# Patient Record
Sex: Female | Born: 1970 | Race: White | Hispanic: No | Smoking: Never smoker
Health system: Southern US, Community
[De-identification: ages and names within clinical notes are randomized; demographics above are authoritative.]

## PROBLEM LIST (undated history)

## (undated) DIAGNOSIS — R87619 Unspecified abnormal cytological findings in specimens from cervix uteri: Secondary | ICD-10-CM

## (undated) DIAGNOSIS — Z1371 Encounter for nonprocreative screening for genetic disease carrier status: Secondary | ICD-10-CM

## (undated) DIAGNOSIS — C50912 Malignant neoplasm of unspecified site of left female breast: Secondary | ICD-10-CM

## (undated) DIAGNOSIS — F329 Major depressive disorder, single episode, unspecified: Secondary | ICD-10-CM

## (undated) DIAGNOSIS — F419 Anxiety disorder, unspecified: Secondary | ICD-10-CM

## (undated) DIAGNOSIS — F32A Depression, unspecified: Secondary | ICD-10-CM

## (undated) HISTORY — DX: Anxiety disorder, unspecified: F41.9

## (undated) HISTORY — DX: Encounter for nonprocreative screening for genetic disease carrier status: Z13.71

## (undated) HISTORY — DX: Malignant neoplasm of unspecified site of left female breast: C50.912

## (undated) HISTORY — DX: Major depressive disorder, single episode, unspecified: F32.9

## (undated) HISTORY — DX: Unspecified abnormal cytological findings in specimens from cervix uteri: R87.619

## (undated) HISTORY — DX: Depression, unspecified: F32.A

---

## 1997-05-19 DIAGNOSIS — C50912 Malignant neoplasm of unspecified site of left female breast: Secondary | ICD-10-CM

## 1997-05-19 HISTORY — PX: BREAST LUMPECTOMY: SHX2

## 1997-05-19 HISTORY — DX: Malignant neoplasm of unspecified site of left female breast: C50.912

## 1998-05-19 HISTORY — PX: TUBAL LIGATION: SHX77

## 2012-03-08 DIAGNOSIS — Z1371 Encounter for nonprocreative screening for genetic disease carrier status: Secondary | ICD-10-CM

## 2012-03-08 HISTORY — DX: Encounter for nonprocreative screening for genetic disease carrier status: Z13.71

## 2012-05-19 HISTORY — PX: BREAST RECONSTRUCTION: SHX9

## 2014-11-14 ENCOUNTER — Encounter: Payer: Self-pay | Admitting: Nurse Practitioner

## 2014-11-14 ENCOUNTER — Telehealth: Payer: Self-pay | Admitting: Nurse Practitioner

## 2014-11-14 ENCOUNTER — Ambulatory Visit (INDEPENDENT_AMBULATORY_CARE_PROVIDER_SITE_OTHER): Payer: PRIVATE HEALTH INSURANCE | Admitting: Nurse Practitioner

## 2014-11-14 VITALS — BP 110/66 | HR 68 | Ht 70.5 in | Wt 180.0 lb

## 2014-11-14 DIAGNOSIS — Z113 Encounter for screening for infections with a predominantly sexual mode of transmission: Secondary | ICD-10-CM

## 2014-11-14 DIAGNOSIS — Z01419 Encounter for gynecological examination (general) (routine) without abnormal findings: Secondary | ICD-10-CM | POA: Diagnosis not present

## 2014-11-14 DIAGNOSIS — Z0000 Encounter for general adult medical examination without abnormal findings: Secondary | ICD-10-CM

## 2014-11-14 DIAGNOSIS — C50912 Malignant neoplasm of unspecified site of left female breast: Secondary | ICD-10-CM

## 2014-11-14 NOTE — Telephone Encounter (Signed)
Routing to Patricia Rolen-Grubb, FNP for review and advise. 

## 2014-11-14 NOTE — Progress Notes (Signed)
44 y.o. G69P0101 Widowed  Caucasian Fe here for NGYN annual exam.  She has been diagnosed with left breast cancer in 1999.  She was given Tamoxifen for 5 years.  She then had breast reconstruction in 2014 and because of irregular cells on the right breast - she was advised to go back on Tamoxifen for another 5 years. Since back on tamoxifen having irregular menses.  Currently no menses for 6 months until this cycle on 11/12/14.  Moved to [place] a year ago.  New relationship for a month.  Prior to that she had been with a former partner and felt really irritated and treated with OTC antifungal which seemed to help.  She would like STD testing.  She also would like to establish with Oncologist here as well. She has not had a recent Mammo.  Patient's last menstrual period was 11/12/2014 (exact date).          Sexually active: Yes.    The current method of family planning is tubal ligation.    Exercising: Yes.    eliptical, weight machines at gym 2-3 times per week Smoker:  no  Health Maintenance: Pap:  2015, normal per patient, pos HR HPV 15 years ago, normal since MMG:  2015, normal in the spring Colonoscopy:  W/in 10 years- normal due to breast cancer  BMD:   W/10 years due to chemo and breast cancer, normal TDaP:  UTD Labs: PCP   reports that she has never smoked. She has never used smokeless tobacco. She reports that she drinks alcohol. She reports that she does not use illicit drugs.  Past Medical History  Diagnosis Date  . Breast cancer, left breast 1999    estrogen fed  . Anxiety   . Depression   . Abnormal Pap smear of cervix 15 years ago    pos HR HPV    Past Surgical History  Procedure Laterality Date  . Tubal ligation Bilateral 2000  . Breast lumpectomy Left 1999    treated with chemo and radiation, then Tamoxifem  . Breast reconstruction Bilateral 2014    lift(left) and reduction (right)  back on Tamoxifem late 2013    Current Outpatient Prescriptions  Medication Sig Dispense  Refill  . ALPRAZolam (XANAX) 0.5 MG tablet Take 0.25 mg by mouth at bedtime as needed for anxiety.    Marland Kitchen loratadine (CLARITIN) 10 MG tablet Take 10 mg by mouth daily.    . montelukast (SINGULAIR) 10 MG tablet Take 10 mg by mouth at bedtime.    . sertraline (ZOLOFT) 50 MG tablet Take 150 mg by mouth daily.    . tamoxifen (NOLVADEX) 20 MG tablet Take 20 mg by mouth daily.     No current facility-administered medications for this visit.    Family History  Problem Relation Age of Onset  . Breast cancer Maternal Grandmother   . Stroke Maternal Grandfather   . Heart disease Paternal Grandfather   . Bipolar disorder Son     ROS:  Pertinent items are noted in HPI.  Otherwise, a comprehensive ROS was negative.  Exam:   BP 110/66 mmHg  Pulse 68  Ht 5' 10.5" (1.791 m)  Wt 180 lb (81.647 kg)  BMI 25.45 kg/m2  LMP 11/12/2014 (Exact Date) Height: 5' 10.5" (179.1 cm) Ht Readings from Last 3 Encounters:  11/14/14 5' 10.5" (1.791 m)    General appearance: alert, cooperative and appears stated age Head: Normocephalic, without obvious abnormality, atraumatic Neck: no adenopathy, supple, symmetrical, trachea midline and  thyroid normal to inspection and palpation and enlarged Lungs: clear to auscultation bilaterally Breasts: surgical changes bilaterally, radiation changes on the left. Heart: regular rate and rhythm Abdomen: soft, non-tender; no masses,  no organomegaly Extremities: extremities normal, atraumatic, no cyanosis or edema Skin: Skin color, texture, turgor normal. No rashes or lesions Lymph nodes: Cervical, supraclavicular, and axillary nodes normal. No abnormal inguinal nodes palpated Neurologic: Grossly normal   Pelvic: External genitalia:  no lesions              Urethra:  normal appearing urethra with no masses, tenderness or lesions              Bartholin's and Skene's: normal                 Vagina: normal appearing vagina with normal color and moderate vaginal bleeding, no  lesions              Cervix: anteverted              Pap taken: Yes.   Bimanual Exam:  Uterus:  normal size, contour, position, consistency, mobility, non-tender              Adnexa: no mass, fullness, tenderness               Rectovaginal: Confirms               Anus:  normal sphincter tone, no lesions  Chaperone present: Yes  A:  Well Woman with normal exam  S/P left Breast Cancer 1999, +ER/PR  BRCA negative  S/P bilateral reconstruction 2014 - back on Tamoxifen  Irregular menses since back on Tamoxifen  Remote history of abnormal pap late 20's with + HR HPV  R/O STD's  P:   Reviewed health and wellness pertinent to exam  Pap smear as above  Mammogram is due and she is given information for calling to schedule  Will refer her to Oncology   Follow with pap and other labs  Counseled on breast self exam, mammography screening, adequate intake of calcium and vitamin D, diet and exercise return annually or prn  An After Visit Summary was printed and given to the patient.

## 2014-11-14 NOTE — Telephone Encounter (Signed)
Patient was told to have some labs one at her PCP. Patient cannot remember which labs she needs to have done. Last seen today with Tammy Bolton.

## 2014-11-14 NOTE — Patient Instructions (Addendum)

## 2014-11-14 NOTE — Telephone Encounter (Signed)
On the bottom of her instruction sheet to take home was this information:  Have her PCP to check her thyroid because it was upper normal in size and to do a TSH.

## 2014-11-14 NOTE — Progress Notes (Signed)
Encounter reviewed by Dr. Aundria Rud. Having skipped cycles.  On Tamoxifen.

## 2014-11-15 ENCOUNTER — Other Ambulatory Visit: Payer: Self-pay | Admitting: Certified Nurse Midwife

## 2014-11-15 ENCOUNTER — Telehealth: Payer: Self-pay | Admitting: *Deleted

## 2014-11-15 DIAGNOSIS — B373 Candidiasis of vulva and vagina: Secondary | ICD-10-CM

## 2014-11-15 DIAGNOSIS — B3731 Acute candidiasis of vulva and vagina: Secondary | ICD-10-CM

## 2014-11-15 LAB — WET PREP BY MOLECULAR PROBE
CANDIDA SPECIES: POSITIVE — AB
GARDNERELLA VAGINALIS: NEGATIVE
Trichomonas vaginosis: NEGATIVE

## 2014-11-15 LAB — STD PANEL
HIV 1&2 Ab, 4th Generation: NONREACTIVE
Hepatitis B Surface Ag: NEGATIVE

## 2014-11-15 MED ORDER — TERCONAZOLE 0.4 % VA CREA: 1.0000 | TOPICAL_CREAM | Freq: Every day | VAGINAL | Status: DC

## 2014-11-15 NOTE — Telephone Encounter (Signed)
I have attempted to contact this patient by phone with the following results: left message to return call to [place] at [phone] on answering machine (mobile per [facility]). Pt name verified in voicemail. Advised message is regarding recent labs and medication has been called to pharmacy for yeast infection. Advised I will be out of the office this afternoon, but OK to return call on Thursday if she is not able to call back today. [phone] (Mobile)

## 2014-11-15 NOTE — Telephone Encounter (Signed)
-----   Message from Regina Eck, CNM sent at 11/15/2014 11:44 AM EDT ----- Notify patient that affirm is positive for yeast. Order in for Terazol 7, advise if problems. BV and trichomonas negative

## 2014-11-15 NOTE — Telephone Encounter (Signed)
Left message to call Izac Faulkenberry at [phone]. 

## 2014-11-16 LAB — IPS N GONORRHOEA AND CHLAMYDIA BY PCR

## 2014-11-17 ENCOUNTER — Telehealth: Payer: Self-pay | Admitting: *Deleted

## 2014-11-17 LAB — IPS PAP TEST WITH HPV

## 2014-11-17 NOTE — Telephone Encounter (Signed)
Received referral from OBG Mardene Celeste Grubbs's office).  Called and left a message for the pt to return my call so I can find out where she has been treated at so I can obtain records and get her scheduled w/ a Med Onc.

## 2014-11-17 NOTE — Telephone Encounter (Signed)
Pt notified in result note.  Closing encounter. 

## 2014-11-22 ENCOUNTER — Telehealth: Payer: Self-pay | Admitting: *Deleted

## 2014-11-22 NOTE — Telephone Encounter (Signed)
Left message for the pt to return my call so I can schedule her w/ a med onc.

## 2014-11-23 NOTE — Telephone Encounter (Signed)
Left message to call Kaitlyn at [phone]. 

## 2014-11-24 ENCOUNTER — Telehealth: Payer: Self-pay | Admitting: *Deleted

## 2014-11-24 NOTE — Telephone Encounter (Signed)
Pt returned my call & I confirmed 12/13/14 med onc appt w/ pt.  Informed her that I need to obtain her paperwork from the other state and she has it and will bring by today.  I placed her calendar, welcoming packet & intake form w/ a medical release to sign w/ Mrs. Darryll Capers at the front desk this morning w/ instructions for the paperwork to come back to me.  Pt has [employer] and does not require a Referral Auth.  Made Kem Boroughs at referring office aware.

## 2014-11-28 ENCOUNTER — Other Ambulatory Visit: Payer: Self-pay

## 2014-11-28 DIAGNOSIS — Z1231 Encounter for screening mammogram for malignant neoplasm of breast: Secondary | ICD-10-CM

## 2014-11-29 NOTE — Telephone Encounter (Signed)
Spoke with patient. Patient states that she had labs performed yesterday. Advised of message as seen below from Milford Cage, [place]. Patient is agreeable and states "A lot of lab work was done." Unsure if TSH was performed. Advised this is the testing that Milford Cage, FNP recommended and to check with PCP. Patient is agreeable. Will return call if she needs anything.  Routing to provider for final review. Patient agreeable to disposition. Will close encounter.   Patient aware provider will review message and nurse will return call if any additional advice or change of disposition.

## 2014-12-11 ENCOUNTER — Ambulatory Visit
Admission: RE | Admit: 2014-12-11 | Discharge: 2014-12-11 | Disposition: A | Discharge status: Home / Self Care | Payer: PRIVATE HEALTH INSURANCE | Source: Ambulatory Visit

## 2014-12-11 DIAGNOSIS — Z1231 Encounter for screening mammogram for malignant neoplasm of breast: Secondary | ICD-10-CM

## 2014-12-12 ENCOUNTER — Other Ambulatory Visit: Payer: Self-pay | Admitting: Family Medicine

## 2014-12-12 DIAGNOSIS — E049 Nontoxic goiter, unspecified: Secondary | ICD-10-CM

## 2014-12-13 ENCOUNTER — Encounter: Payer: Self-pay | Admitting: Hematology and Oncology

## 2014-12-13 ENCOUNTER — Ambulatory Visit (HOSPITAL_BASED_OUTPATIENT_CLINIC_OR_DEPARTMENT_OTHER): Payer: PRIVATE HEALTH INSURANCE | Admitting: Hematology and Oncology

## 2014-12-13 ENCOUNTER — Telehealth: Payer: Self-pay | Admitting: Hematology and Oncology

## 2014-12-13 ENCOUNTER — Ambulatory Visit (HOSPITAL_BASED_OUTPATIENT_CLINIC_OR_DEPARTMENT_OTHER): Payer: PRIVATE HEALTH INSURANCE

## 2014-12-13 VITALS — BP 129/81 | HR 80 | Temp 98.6°F | Resp 18 | Ht 70.5 in | Wt 182.9 lb

## 2014-12-13 DIAGNOSIS — Z79810 Long term (current) use of selective estrogen receptor modulators (SERMs): Secondary | ICD-10-CM

## 2014-12-13 DIAGNOSIS — C773 Secondary and unspecified malignant neoplasm of axilla and upper limb lymph nodes: Secondary | ICD-10-CM | POA: Diagnosis not present

## 2014-12-13 DIAGNOSIS — C50912 Malignant neoplasm of unspecified site of left female breast: Secondary | ICD-10-CM

## 2014-12-13 DIAGNOSIS — Z170 Estrogen receptor positive status [ER+]: Secondary | ICD-10-CM

## 2014-12-13 DIAGNOSIS — C50312 Malignant neoplasm of lower-inner quadrant of left female breast: Secondary | ICD-10-CM | POA: Insufficient documentation

## 2014-12-13 NOTE — Progress Notes (Signed)
[facility] CONSULT NOTE  Patient Care Team: Tamsen Roers, MD as PCP - General (Family Medicine)  CHIEF COMPLAINTS/PURPOSE OF CONSULTATION:  Establish oncology care for history of left breast cancer  HISTORY OF PRESENTING ILLNESS:  Tammy Bolton 44 y.o. female is here because of a prior history of left breast cancer. She was diagnosed in September 1999 when she was [redacted] weeks pregnant. She was induced and after she had the baby she underwent lumpectomy on October 1999. She had a stage II a breast cancer that was involving 2 out of 9 positive lymph nodes. It was ER positive PR negative and HER-2 positive. She underwent 8 cycles of Adriamycin and Cytoxan adjuvant chemotherapy followed by 7 weeks of adjuvant radiation. She then took tamoxifen for 5 and half years that completed in 2005. She was observed over the course of the years she has had multiple mammograms and MRIs because she continuously had discomfort in the right breast. All of these tests and procedures of been negative. She decided to undergo breast reduction in February 2014 along with the lift off the left breast. Since then she is doing so much better. She decided to go back on tamoxifen in February 2014 because she wanted to take tamoxifen for a total of 10 year duration. All of the above treatment was given through [facility]. Patient will to [facility] for her family being close to her as well as she is working for the [employer]. Her son is now 19 years old. She reports no pain or discomfort in the breast.  I reviewed her records extensively and collaborated the history with the patient.  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   02/26/1998 Surgery Left breast lumpectomy: Invasive ductal carcinoma high-grade, 1.4 cm, LVIDD, with high-grade DCIS, 2/9 lymph nodes positive T1 cN1 M0 stage II a, ER 10% PR 0% her2 3+ at [facility]    04/09/1998 - 08/13/1998 Chemotherapy Adriamycin and Cytoxan X 8 adjuvant chemotherapy   09/13/1998 - 11/01/1998 Radiation Therapy Adjuvant radiation therapy   10/23/1998 - 05/14/2004 Anti-estrogen oral therapy Tamoxifen 20 mg daily 5 and half years   05/14/2004 Breast MRI Diffuse progressive enhancement of the right breast no focal suspicious lesion identified   03/08/2012 Procedure BRCA1/2 negative   06/28/2012 -  Anti-estrogen oral therapy Tamoxifen 20 mg daily resumed because of data supporting extended adjuvant therapy and patient's concern for increased risk of breast cancer from right breast density    MEDICAL HISTORY:  Past Medical History  Diagnosis Date  . Breast cancer, left breast 1999    estrogen fed  . Anxiety   . Depression   . Abnormal Pap smear of cervix 15 years ago    pos HR HPV    SURGICAL HISTORY: Past Surgical History  Procedure Laterality Date  . Tubal ligation Bilateral 2000  . Breast lumpectomy Left 1999    treated with chemo and radiation, then Tamoxifem  . Breast reconstruction Bilateral 2014    lift(left) and reduction (right)  back on Tamoxifem late 2013    SOCIAL HISTORY: History   Social History  . Marital Status: Widowed    Spouse Name: N/A  . Number of Children: N/A  . Years of Education: N/A   Occupational History  . Not on file.   Social History Main Topics  . Smoking status: Never Smoker   . Smokeless tobacco: Never Used  .  Alcohol Use: 0.0 - 0.6 oz/week    0-1 Standard drinks or equivalent per week     Comment: rare  . Drug Use: No  . Sexual Activity:    Partners: Male    Birth Control/ Protection: Surgical     Comment: BTL   Other Topics Concern  . Not on file   Social History Narrative   Pt was married and husband suffered from depression and committed suicide.    FAMILY HISTORY: Family History  Problem Relation Age of Onset  . Breast cancer Maternal Grandmother   . Stroke Maternal Grandfather   . Heart disease  Paternal Grandfather   . Bipolar disorder Son     ALLERGIES:  has No Known Allergies.  MEDICATIONS:  Current Outpatient Prescriptions  Medication Sig Dispense Refill  . ALPRAZolam (XANAX) 0.5 MG tablet Take 0.25 mg by mouth at bedtime as needed for anxiety.    Marland Kitchen loratadine (CLARITIN) 10 MG tablet Take 10 mg by mouth daily.    . montelukast (SINGULAIR) 10 MG tablet Take 10 mg by mouth at bedtime.    . sertraline (ZOLOFT) 50 MG tablet Take 150 mg by mouth daily.    . tamoxifen (NOLVADEX) 20 MG tablet Take 20 mg by mouth daily.    Marland Kitchen terconazole (TERAZOL 7) 0.4 % vaginal cream Place 1 applicator vaginally at bedtime. (Patient not taking: Reported on 12/13/2014) 45 g 0   No current facility-administered medications for this visit.    REVIEW OF SYSTEMS:   Constitutional: Denies fevers, chills or abnormal night sweats Eyes: Denies blurriness of vision, double vision or watery eyes Ears, nose, mouth, throat, and face: Denies mucositis or sore throat Respiratory: Denies cough, dyspnea or wheezes Cardiovascular: Denies palpitation, chest discomfort or lower extremity swelling Gastrointestinal:  Denies nausea, heartburn or change in bowel habits Skin: Denies abnormal skin rashes Lymphatics: Denies new lymphadenopathy or easy bruising Neurological:Denies numbness, tingling or new weaknesses Behavioral/Psych: Mood is stable, no new changes  Breast:  Denies any palpable lumps or discharge All other systems were reviewed with the patient and are negative.  PHYSICAL EXAMINATION: ECOG PERFORMANCE STATUS: 0 - Asymptomatic  Filed Vitals:   12/13/14 1529  BP: 129/81  Pulse: 80  Temp: 98.6 F (37 C)  Resp: 18   Filed Weights   12/13/14 1529  Weight: 182 lb 14.4 oz (82.963 kg)    GENERAL:alert, no distress and comfortable SKIN: skin color, texture, turgor are normal, no rashes or significant lesions EYES: normal, conjunctiva are pink and non-injected, sclera clear OROPHARYNX:no exudate,  no erythema and lips, buccal mucosa, and tongue normal  NECK: supple, thyroid normal size, non-tender, without nodularity LYMPH:  no palpable lymphadenopathy in the cervical, axillary or inguinal LUNGS: clear to auscultation and percussion with normal breathing effort HEART: regular rate & rhythm and no murmurs and no lower extremity edema ABDOMEN:abdomen soft, non-tender and normal bowel sounds Musculoskeletal:no cyanosis of digits and no clubbing  PSYCH: alert & oriented x 3 with fluent speech NEURO: no focal motor/sensory deficits BREAST: No palpable nodules in breast. Scars from breast reduction and breast lift were noted. No palpable axillary or supraclavicular lymphadenopathy (exam performed in the presence of a chaperone)   ASSESSMENT AND PLAN:  Left breast lumpectomy 02/26/1998: Invasive ductal carcinoma high-grade, 1.4 cm, LVIDD, with high-grade DCIS, 2/9 lymph nodes positive T1 cN1 M0 stage II a, ER 10% PR 0% her2 3+ at [facility] status post adjuvant chemotherapy with Adriamycin and Cytoxan 8 followed by radiation and 5 years  of tamoxifen completed in December 2005, BRCA1 and 2 mutation negative, breast reduction and lift surgeries February 2014  Current treatment: Tamoxifen resumed October 2014 because of persistent right breast discomfort and patient's anxiety for recurrence of breast cancer in the right breast. Plan is to treat her for 5 additional years to complete in October 2019  Breast cancer surveillance: 1. Breast exam 12/13/2014 is without abnormalities 2. Mammogram 12/11/2014 normal I discussed with her that she does not need lab testing on tumor markers for surveillance of breast cancer. Return to clinic once a year for follow-up.  All questions were answered. The patient knows to call the clinic with any problems, questions or concerns.    Rulon Eisenmenger, MD 4:28 PM

## 2014-12-13 NOTE — Telephone Encounter (Signed)
Appointments made and avs printed °

## 2014-12-13 NOTE — Progress Notes (Signed)
Checked in new pt with no financial concerns. Gave pt my card should she have any fin questions or concerns.

## 2015-03-12 ENCOUNTER — Encounter (INDEPENDENT_AMBULATORY_CARE_PROVIDER_SITE_OTHER): Payer: Self-pay

## 2015-03-12 ENCOUNTER — Ambulatory Visit
Admission: RE | Admit: 2015-03-12 | Discharge: 2015-03-12 | Disposition: A | Discharge status: Home / Self Care | Payer: PRIVATE HEALTH INSURANCE | Source: Ambulatory Visit | Attending: Family Medicine | Admitting: Family Medicine

## 2015-03-12 DIAGNOSIS — E049 Nontoxic goiter, unspecified: Secondary | ICD-10-CM

## 2015-06-21 ENCOUNTER — Other Ambulatory Visit: Payer: Self-pay

## 2015-06-21 DIAGNOSIS — C50312 Malignant neoplasm of lower-inner quadrant of left female breast: Secondary | ICD-10-CM

## 2015-06-21 MED ORDER — TAMOXIFEN CITRATE 20 MG PO TABS: 20.0000 mg | ORAL_TABLET | Freq: Every day | ORAL | Status: DC

## 2015-11-16 ENCOUNTER — Ambulatory Visit (INDEPENDENT_AMBULATORY_CARE_PROVIDER_SITE_OTHER): Payer: PRIVATE HEALTH INSURANCE | Admitting: Nurse Practitioner

## 2015-11-16 ENCOUNTER — Encounter: Payer: Self-pay | Admitting: Nurse Practitioner

## 2015-11-16 VITALS — BP 110/78 | HR 86 | Resp 16 | Ht 70.25 in | Wt 188.2 lb

## 2015-11-16 DIAGNOSIS — Z113 Encounter for screening for infections with a predominantly sexual mode of transmission: Secondary | ICD-10-CM

## 2015-11-16 DIAGNOSIS — Z0000 Encounter for general adult medical examination without abnormal findings: Secondary | ICD-10-CM

## 2015-11-16 DIAGNOSIS — Z01419 Encounter for gynecological examination (general) (routine) without abnormal findings: Secondary | ICD-10-CM

## 2015-11-16 LAB — POCT URINALYSIS DIPSTICK
Bilirubin, UA: NEGATIVE
Glucose, UA: NEGATIVE
Ketones, UA: NEGATIVE
LEUKOCYTES UA: NEGATIVE
NITRITE UA: NEGATIVE
PH UA: 5
PROTEIN UA: NEGATIVE
UROBILINOGEN UA: NEGATIVE

## 2015-11-16 NOTE — Progress Notes (Signed)
45 y.o. G54P0101 Widowed Caucasian Fe here for annual exam. Ended last relationship recently.  no vaginal dryness.   LMP documented 11/12/14.  May have had another cycle last fall.  Nothing this year.  She remains on Tamoxifen and now followed by Oncology here. Will continue med's until 02/2018.  Son will be 18 this year, he will be going to [place] to stay with family for awhile.  No LMP recorded. Patient is not currently having periods (Reason: Chemotherapy).          Sexually active: Yes.    The current method of family planning is tubal ligation.    Exercising: Yes.    Some Smoker:  no  Health Maintenance: Pap:  11/14/14 WNL neg HR HPV MMG: 12/07/14 BIRADS1 TDaP:  UTD HIV: 11/14/14 neg Labs: Blood has been drawn today.  Urine-Trace blood   reports that she has never smoked. She has never used smokeless tobacco. She reports that she drinks alcohol. She reports that she does not use illicit drugs.  Past Medical History  Diagnosis Date  . Breast cancer, left breast ([place]) 1999    estrogen fed  . Anxiety   . Depression   . Abnormal Pap smear of cervix 15 years ago    pos HR HPV  . BRCA negative 03/08/12    Past Surgical History  Procedure Laterality Date  . Tubal ligation Bilateral 2000  . Breast lumpectomy Left 1999    treated with chemo and radiation, then Tamoxifem  . Breast reconstruction Bilateral 2014    lift(left) and reduction (right)  back on Tamoxifem late 2013    Current Outpatient Prescriptions  Medication Sig Dispense Refill  . ALPRAZolam (XANAX) 0.5 MG tablet Take 0.25 mg by mouth at bedtime as needed for anxiety.    Marland Kitchen loratadine (CLARITIN) 10 MG tablet Take 10 mg by mouth daily.    . montelukast (SINGULAIR) 10 MG tablet Take 10 mg by mouth at bedtime.    . sertraline (ZOLOFT) 50 MG tablet Take 150 mg by mouth daily.    . tamoxifen (NOLVADEX) 20 MG tablet Take 1 tablet (20 mg total) by mouth daily. 30 tablet 5   No current facility-administered medications for  this visit.    Family History  Problem Relation Age of Onset  . Breast cancer Maternal Grandmother   . Stroke Maternal Grandfather   . Heart disease Paternal Grandfather   . Bipolar disorder Son     ROS:  Pertinent items are noted in HPI.  Otherwise, a comprehensive ROS was negative.  Exam:   BP 110/78 mmHg  Pulse 86  Resp 16  Ht 5' 10.25" (1.784 m)  Wt 188 lb 3.2 oz (85.367 kg)  BMI 26.82 kg/m2 Height: 5' 10.25" (178.4 cm) Ht Readings from Last 3 Encounters:  11/16/15 5' 10.25" (1.784 m)  12/13/14 5' 10.5" (1.791 m)  11/14/14 5' 10.5" (1.791 m)    General appearance: alert, cooperative and appears stated age Head: Normocephalic, without obvious abnormality, atraumatic Neck: no adenopathy, supple, symmetrical, trachea midline and thyroid normal to inspection and palpation Lungs: clear to auscultation bilaterally Breasts: normal appearance, no masses or tenderness, on the right.  Left breast with surgical changes, no mass. Heart: regular rate and rhythm Abdomen: soft, non-tender; no masses,  no organomegaly Extremities: extremities normal, atraumatic, no cyanosis or edema Skin: Skin color, texture, turgor normal. No rashes or lesions Lymph nodes: Cervical, supraclavicular, and axillary nodes normal. No abnormal inguinal nodes palpated Neurologic: Grossly normal   Pelvic:  External genitalia:  no lesions              Urethra:  normal appearing urethra with no masses, tenderness or lesions              Bartholin's and Skene's: normal                 Vagina: normal appearing vagina with normal color and discharge, no lesions              Cervix: anteverted              Pap taken: No. Bimanual Exam:  Uterus:  normal size, contour, position, consistency, mobility, non-tender              Adnexa: no mass, fullness, tenderness               Rectovaginal: Confirms               Anus:  normal sphincter tone, no lesions  Chaperone present: yes  A:  Well Woman with normal  exam  S/P left Breast Cancer 1999, +ER/PR BRCA negative S/P bilateral reconstruction 2014 - back on Tamoxifen Irregular menses - currently amenorrhea Remote history of abnormal pap late 20's with + HR HPV R/O STD's  P:   Reviewed health and wellness pertinent to exam  Pap smear not done today, remote history of abnormal in early 20's.  Mammogram is due 11/2015  Report any vaginal bleeding  Follow with labs  She did see dermatology and had removal of a mole - she will find results and send to [place].  Counseled on breast self exam, mammography screening, adequate intake of calcium and vitamin D, diet and exercise return annually or prn  An After Visit Summary was printed and given to the patient.

## 2015-11-16 NOTE — Patient Instructions (Signed)

## 2015-11-16 NOTE — Progress Notes (Signed)
Encounter reviewed by Dr. Brook Amundson C. Silva.  

## 2015-11-17 LAB — STD PANEL
HIV: NONREACTIVE
Hepatitis B Surface Ag: NEGATIVE

## 2015-11-22 LAB — IPS N GONORRHOEA AND CHLAMYDIA BY PCR

## 2015-12-12 ENCOUNTER — Other Ambulatory Visit: Payer: Self-pay

## 2015-12-12 DIAGNOSIS — C50312 Malignant neoplasm of lower-inner quadrant of left female breast: Secondary | ICD-10-CM

## 2015-12-13 ENCOUNTER — Other Ambulatory Visit (HOSPITAL_BASED_OUTPATIENT_CLINIC_OR_DEPARTMENT_OTHER): Payer: PRIVATE HEALTH INSURANCE

## 2015-12-13 ENCOUNTER — Encounter: Payer: Self-pay | Admitting: Hematology and Oncology

## 2015-12-13 ENCOUNTER — Telehealth: Payer: Self-pay | Admitting: Hematology and Oncology

## 2015-12-13 ENCOUNTER — Ambulatory Visit (HOSPITAL_BASED_OUTPATIENT_CLINIC_OR_DEPARTMENT_OTHER): Payer: PRIVATE HEALTH INSURANCE | Admitting: Hematology and Oncology

## 2015-12-13 DIAGNOSIS — Z170 Estrogen receptor positive status [ER+]: Secondary | ICD-10-CM

## 2015-12-13 DIAGNOSIS — R945 Abnormal results of liver function studies: Secondary | ICD-10-CM

## 2015-12-13 DIAGNOSIS — C50312 Malignant neoplasm of lower-inner quadrant of left female breast: Secondary | ICD-10-CM

## 2015-12-13 DIAGNOSIS — Z79810 Long term (current) use of selective estrogen receptor modulators (SERMs): Secondary | ICD-10-CM

## 2015-12-13 DIAGNOSIS — R7989 Other specified abnormal findings of blood chemistry: Secondary | ICD-10-CM

## 2015-12-13 LAB — CBC WITH DIFFERENTIAL/PLATELET
BASO%: 0.4 % (ref 0.0–2.0)
BASOS ABS: 0 10*3/uL (ref 0.0–0.1)
EOS%: 3.1 % (ref 0.0–7.0)
Eosinophils Absolute: 0.3 10*3/uL (ref 0.0–0.5)
HEMATOCRIT: 40 % (ref 34.8–46.6)
HEMOGLOBIN: 13.2 g/dL (ref 11.6–15.9)
LYMPH#: 2.7 10*3/uL (ref 0.9–3.3)
LYMPH%: 27.6 % (ref 14.0–49.7)
MCH: 29.7 pg (ref 25.1–34.0)
MCHC: 32.9 g/dL (ref 31.5–36.0)
MCV: 90.1 fL (ref 79.5–101.0)
MONO#: 0.8 10*3/uL (ref 0.1–0.9)
MONO%: 8.5 % (ref 0.0–14.0)
NEUT%: 60.4 % (ref 38.4–76.8)
NEUTROS ABS: 5.8 10*3/uL (ref 1.5–6.5)
Platelets: 198 10*3/uL (ref 145–400)
RBC: 4.44 10*6/uL (ref 3.70–5.45)
RDW: 13.3 % (ref 11.2–14.5)
WBC: 9.6 10*3/uL (ref 3.9–10.3)

## 2015-12-13 LAB — COMPREHENSIVE METABOLIC PANEL
ALBUMIN: 3.8 g/dL (ref 3.5–5.0)
ALK PHOS: 78 U/L (ref 40–150)
ALT: 84 U/L — AB (ref 0–55)
AST: 43 U/L — AB (ref 5–34)
Anion Gap: 9 mEq/L (ref 3–11)
BILIRUBIN TOTAL: 0.58 mg/dL (ref 0.20–1.20)
BUN: 11.3 mg/dL (ref 7.0–26.0)
CALCIUM: 9.6 mg/dL (ref 8.4–10.4)
CO2: 25 mEq/L (ref 22–29)
CREATININE: 0.9 mg/dL (ref 0.6–1.1)
Chloride: 105 mEq/L (ref 98–109)
EGFR: 73 mL/min/{1.73_m2} — ABNORMAL LOW (ref >90)
GLUCOSE: 109 mg/dL (ref 70–140)
Potassium: 3.7 mEq/L (ref 3.5–5.1)
Sodium: 140 mEq/L (ref 136–145)
TOTAL PROTEIN: 7.1 g/dL (ref 6.4–8.3)

## 2015-12-13 NOTE — Progress Notes (Signed)
Patient Care Team: Tamsen Roers, MD as PCP - General (Family Medicine)  SUMMARY OF ONCOLOGIC HISTORY:   Breast cancer, left breast ([place]) (Resolved)   01/30/1998 Initial Diagnosis    Invasive ductal carcinoma high grade     02/26/1998 Surgery    Left breast lumpectomy: Invasive ductal carcinoma high-grade, 1.4 cm, LVIDD, with high-grade DCIS, 2/9 lymph nodes positive T1 cN1 M0 stage II a, ER 10% PR 0% her2 3+ at [facility]     04/09/1998 - 08/13/1998 Chemotherapy    Adriamycin and Cytoxan X 8 adjuvant chemotherapy     09/13/1998 - 11/01/1998 Radiation Therapy    Adjuvant radiation therapy     10/23/1998 - 05/14/2004 Anti-estrogen oral therapy    Tamoxifen 20 mg daily 5 and half years     05/14/2004 Breast MRI    Diffuse progressive enhancement of the right breast no focal suspicious lesion identified     03/08/2012 Procedure    BRCA1/2 negative     06/28/2012 -  Anti-estrogen oral therapy    Tamoxifen 20 mg daily resumed because of data supporting extended adjuvant therapy and patient's concern for increased risk of breast cancer from right breast density      CHIEF COMPLIANT: Follow-up on tamoxifen therapy  INTERVAL HISTORY: Tammy Bolton is a 45 year old with above-mentioned history of left breast cancer treated with surgery followed by adjuvant chemotherapy and radiation is currently on tamoxifen. She decided to stay on extended tamoxifen therapy. She reports no major problems tolerating it. She denies any hot flashes myalgias. She has not had periods in about a year. She had blood work today which show abnormal AST and ALT levels suggestive of fatty liver.  REVIEW OF SYSTEMS:   Constitutional: Denies fevers, chills or abnormal weight loss Eyes: Denies blurriness of vision Ears, nose, mouth, throat, and face: Denies mucositis or sore throat Respiratory: Denies cough, dyspnea or wheezes Cardiovascular: Denies palpitation, chest discomfort Gastrointestinal:  Denies nausea,  heartburn or change in bowel habits Skin: Denies abnormal skin rashes Lymphatics: Denies new lymphadenopathy or easy bruising Neurological:Denies numbness, tingling or new weaknesses Behavioral/Psych: Mood is stable, no new changes  Extremities: No lower extremity edema Breast:  denies any pain or lumps or nodules in either breasts All other systems were reviewed with the patient and are negative.  I have reviewed the past medical history, past surgical history, social history and family history with the patient and they are unchanged from previous note.  ALLERGIES:  has No Known Allergies.  MEDICATIONS:  Current Outpatient Prescriptions  Medication Sig Dispense Refill  . ALPRAZolam (XANAX) 0.5 MG tablet Take 0.25 mg by mouth at bedtime as needed for anxiety.    Marland Kitchen loratadine (CLARITIN) 10 MG tablet Take 10 mg by mouth daily.    . montelukast (SINGULAIR) 10 MG tablet Take 10 mg by mouth at bedtime.    . sertraline (ZOLOFT) 50 MG tablet Take 150 mg by mouth daily.    . tamoxifen (NOLVADEX) 20 MG tablet Take 1 tablet (20 mg total) by mouth daily. 30 tablet 5   No current facility-administered medications for this visit.     PHYSICAL EXAMINATION: ECOG PERFORMANCE STATUS: 1 - Symptomatic but completely ambulatory  Vitals:   12/13/15 1518  BP: 126/82  Pulse: 75  Resp: 18  Temp: 98.8 F (37.1 C)   Filed Weights   12/13/15 1518  Weight: 186 lb 1 oz (84.4 kg)    GENERAL:alert, no distress and comfortable SKIN: skin color, texture, turgor are normal,  no rashes or significant lesions EYES: normal, Conjunctiva are pink and non-injected, sclera clear OROPHARYNX:no exudate, no erythema and lips, buccal mucosa, and tongue normal  NECK: supple, thyroid normal size, non-tender, without nodularity LYMPH:  no palpable lymphadenopathy in the cervical, axillary or inguinal LUNGS: clear to auscultation and percussion with normal breathing effort HEART: regular rate & rhythm and no murmurs  and no lower extremity edema ABDOMEN:abdomen soft, non-tender and normal bowel sounds MUSCULOSKELETAL:no cyanosis of digits and no clubbing  NEURO: alert & oriented x 3 with fluent speech, no focal motor/sensory deficits EXTREMITIES: No lower extremity edema BREAST: No palpable masses or nodules in either right or left breasts. No palpable axillary supraclavicular or infraclavicular adenopathy no breast tenderness or nipple discharge. (exam performed in the presence of a chaperone)  LABORATORY DATA:  I have reviewed the data as listed   Chemistry      Component Value Date/Time   NA 140 12/13/2015 1507   K 3.7 12/13/2015 1507   CO2 25 12/13/2015 1507   BUN 11.3 12/13/2015 1507   CREATININE 0.9 12/13/2015 1507      Component Value Date/Time   CALCIUM 9.6 12/13/2015 1507   ALKPHOS 78 12/13/2015 1507   AST 43 (H) 12/13/2015 1507   ALT 84 (H) 12/13/2015 1507   BILITOT 0.58 12/13/2015 1507       Lab Results  Component Value Date   WBC 9.6 12/13/2015   HGB 13.2 12/13/2015   HCT 40.0 12/13/2015   MCV 90.1 12/13/2015   PLT 198 12/13/2015   NEUTROABS 5.8 12/13/2015   ASSESSMENT & PLAN:  Malignant neoplasm of lower-inner quadrant of left female breast ([place]) Left breast lumpectomy 02/26/1998: Invasive ductal carcinoma high-grade, 1.4 cm, LVIDD, with high-grade DCIS, 2/9 lymph nodes positive T1 cN1 M0 stage II a, ER 10% PR 0% her2 3+ at [facility] status post adjuvant chemotherapy with Adriamycin and Cytoxan 8 followed by radiation and 5 years of tamoxifen completed in December 2005, BRCA1 and 2 mutation negative, breast reduction and lift surgeries February 2014  Current treatment: Tamoxifen resumed October 2014 because of persistent right breast discomfort and patient's anxiety for recurrence of breast cancer in the right breast. Plan is to treat her for 5 additional years to complete in October 2019  Breast cancer surveillance: 1. Breast exam 12/13/2015 is without abnormalities 2.  Mammogram 12/11/2014 normal  Elevated AST and ALT: I suspect that she has fatty liver. I will obtain ultrasound of the liver to evaluate this further. I encouraged her to reduce the fat content in food and to eat more nutritious diet. I discussed that these changes are reversible.  Return to clinic in 1 year for follow-up   No orders of the defined types were placed in this encounter.  The patient has a good understanding of the overall plan. she agrees with it. she will call with any problems that may develop before the next visit here.   Rulon Eisenmenger, MD 12/13/15

## 2015-12-13 NOTE — Telephone Encounter (Signed)
appt made and avs printed °

## 2015-12-13 NOTE — Assessment & Plan Note (Signed)
Left breast lumpectomy 02/26/1998: Invasive ductal carcinoma high-grade, 1.4 cm, LVIDD, with high-grade DCIS, 2/9 lymph nodes positive T1 cN1 M0 stage II a, ER 10% PR 0% her2 3+ at [facility] status post adjuvant chemotherapy with Adriamycin and Cytoxan 8 followed by radiation and 5 years of tamoxifen completed in December 2005, BRCA1 and 2 mutation negative, breast reduction and lift surgeries February 2014  Current treatment: Tamoxifen resumed October 2014 because of persistent right breast discomfort and patient's anxiety for recurrence of breast cancer in the right breast. Plan is to treat her for 5 additional years to complete in October 2019  Breast cancer surveillance: 1. Breast exam 12/13/2015 is without abnormalities 2. Mammogram 12/11/2014 normal  Return to clinic in 1 year for follow-up

## 2015-12-14 ENCOUNTER — Other Ambulatory Visit: Payer: Self-pay | Admitting: Hematology and Oncology

## 2015-12-14 DIAGNOSIS — Z1231 Encounter for screening mammogram for malignant neoplasm of breast: Secondary | ICD-10-CM

## 2015-12-26 ENCOUNTER — Ambulatory Visit
Admission: RE | Admit: 2015-12-26 | Discharge: 2015-12-26 | Disposition: A | Discharge status: Home / Self Care | Payer: PRIVATE HEALTH INSURANCE | Source: Ambulatory Visit | Attending: Hematology and Oncology | Admitting: Hematology and Oncology

## 2015-12-26 DIAGNOSIS — Z1231 Encounter for screening mammogram for malignant neoplasm of breast: Secondary | ICD-10-CM

## 2016-01-02 ENCOUNTER — Ambulatory Visit (HOSPITAL_COMMUNITY)
Admission: RE | Admit: 2016-01-02 | Discharge: 2016-01-02 | Disposition: A | Discharge status: Home / Self Care | Payer: PRIVATE HEALTH INSURANCE | Source: Ambulatory Visit | Attending: Hematology and Oncology | Admitting: Hematology and Oncology

## 2016-01-02 DIAGNOSIS — R945 Abnormal results of liver function studies: Secondary | ICD-10-CM

## 2016-01-02 DIAGNOSIS — R7989 Other specified abnormal findings of blood chemistry: Secondary | ICD-10-CM | POA: Diagnosis not present

## 2016-01-02 DIAGNOSIS — R932 Abnormal findings on diagnostic imaging of liver and biliary tract: Secondary | ICD-10-CM | POA: Diagnosis not present

## 2016-04-22 ENCOUNTER — Other Ambulatory Visit: Payer: Self-pay | Admitting: Hematology and Oncology

## 2016-04-22 DIAGNOSIS — C50312 Malignant neoplasm of lower-inner quadrant of left female breast: Secondary | ICD-10-CM

## 2016-06-26 ENCOUNTER — Other Ambulatory Visit: Payer: Self-pay | Admitting: Emergency Medicine

## 2016-06-26 DIAGNOSIS — C50312 Malignant neoplasm of lower-inner quadrant of left female breast: Secondary | ICD-10-CM

## 2016-06-26 MED ORDER — TAMOXIFEN CITRATE 20 MG PO TABS: 20.0000 mg | ORAL_TABLET | Freq: Every day | ORAL | 11 refills | Status: DC

## 2016-07-02 ENCOUNTER — Other Ambulatory Visit: Payer: Self-pay | Admitting: *Deleted

## 2016-07-02 NOTE — Telephone Encounter (Signed)
err

## 2016-08-27 ENCOUNTER — Other Ambulatory Visit: Payer: Self-pay | Admitting: Family Medicine

## 2016-08-27 DIAGNOSIS — Z87898 Personal history of other specified conditions: Secondary | ICD-10-CM

## 2016-08-27 DIAGNOSIS — R7989 Other specified abnormal findings of blood chemistry: Secondary | ICD-10-CM

## 2016-08-27 DIAGNOSIS — R945 Abnormal results of liver function studies: Secondary | ICD-10-CM

## 2016-09-11 ENCOUNTER — Ambulatory Visit
Admission: RE | Admit: 2016-09-11 | Discharge: 2016-09-11 | Disposition: A | Discharge status: Home / Self Care | Payer: PRIVATE HEALTH INSURANCE | Source: Ambulatory Visit | Attending: Family Medicine | Admitting: Family Medicine

## 2016-09-11 DIAGNOSIS — R7989 Other specified abnormal findings of blood chemistry: Secondary | ICD-10-CM

## 2016-09-11 DIAGNOSIS — R945 Abnormal results of liver function studies: Secondary | ICD-10-CM

## 2016-09-11 DIAGNOSIS — Z87898 Personal history of other specified conditions: Secondary | ICD-10-CM

## 2016-09-11 MED ORDER — IOPAMIDOL (ISOVUE-300) INJECTION 61%
100.0000 mL | Freq: Once | INTRAVENOUS | Status: AC | PRN
  Administered 2016-09-11: 100 mL via INTRAVENOUS

## 2016-09-19 ENCOUNTER — Telehealth: Payer: Self-pay | Admitting: *Deleted

## 2016-09-19 ENCOUNTER — Telehealth: Payer: Self-pay

## 2016-09-19 NOTE — Telephone Encounter (Signed)
Received call from pt who states she only comes yearly & wants to know if she has an appt.  She doesn't remember her MD's name.  Reviewed chart & informed that she has an appt with Dr Lindi Adie in July.  She reports that her PCP has told her that her liver is enlarging & has been told that she has a fatty liver & that it was caused by alcohol abuse.  She reports that she doesn't really drink.  She states that her pharmacist said that it could be from the tamoxifen.  She would like to discuss with Dr Lindi Adie & wonders if she can get an earlier appt. She has had a [place] of liver & CT of abdomen & lab work done at PCP.  She will ask her PCP to send these results.  She can be reached at her cell # [phone] or work today until 1 pm [phone].  Message routed to Dr Rolla Plate RN

## 2016-09-19 NOTE — Telephone Encounter (Signed)
Called and returned pt call. Scheduled pt for next week.

## 2016-09-19 NOTE — Telephone Encounter (Signed)
na

## 2016-09-23 ENCOUNTER — Encounter: Payer: Self-pay | Admitting: Hematology and Oncology

## 2016-09-23 ENCOUNTER — Ambulatory Visit (HOSPITAL_BASED_OUTPATIENT_CLINIC_OR_DEPARTMENT_OTHER): Payer: PRIVATE HEALTH INSURANCE | Admitting: Hematology and Oncology

## 2016-09-23 DIAGNOSIS — C50312 Malignant neoplasm of lower-inner quadrant of left female breast: Secondary | ICD-10-CM | POA: Diagnosis not present

## 2016-09-23 DIAGNOSIS — Z170 Estrogen receptor positive status [ER+]: Secondary | ICD-10-CM | POA: Diagnosis not present

## 2016-09-23 DIAGNOSIS — Z79810 Long term (current) use of selective estrogen receptor modulators (SERMs): Secondary | ICD-10-CM | POA: Diagnosis not present

## 2016-09-23 NOTE — Progress Notes (Signed)
Patient Care Team: Tamsen Roers, MD as PCP - General (Family Medicine)  DIAGNOSIS:  Encounter Diagnosis  Name Primary?  . Malignant neoplasm of lower-inner quadrant of left breast in female, estrogen receptor positive ([place])     SUMMARY OF ONCOLOGIC HISTORY:   Malignant neoplasm of lower-inner quadrant of left breast in female, estrogen receptor positive ([place])   01/30/1998 Initial Diagnosis    Invasive ductal carcinoma high grade      02/26/1998 Surgery    Left breast lumpectomy: Invasive ductal carcinoma high-grade, 1.4 cm, LVIDD, with high-grade DCIS, 2/9 lymph nodes positive T1 cN1 M0 stage II a, ER 10% PR 0% her2 3+ at [facility]      04/09/1998 - 08/13/1998 Chemotherapy    Adriamycin and Cytoxan X 8 adjuvant chemotherapy      09/13/1998 - 11/01/1998 Radiation Therapy    Adjuvant radiation therapy      10/23/1998 - 05/14/2004 Anti-estrogen oral therapy    Tamoxifen 20 mg daily 5 and half years      05/14/2004 Breast MRI    Diffuse progressive enhancement of the right breast no focal suspicious lesion identified      03/08/2012 Procedure    BRCA1/2 negative      06/28/2012 -  Anti-estrogen oral therapy    Tamoxifen 20 mg daily resumed because of data supporting extended adjuvant therapy and patient's concern for increased risk of breast cancer from right breast density       CHIEF COMPLIANT: follow-up with concern for elevated LFTs  INTERVAL HISTORY: Tammy Bolton is a 46 year old with above-mentioned history of some breast cancer diagnosed in 1999 who took tamoxifen for 5 years and decided to resume it in 2014. We have noticed elevation of AST and ALT and obtain ultrasound of the liver which showed fatty liver in August 2017. Dr. Rex Kras performed a CT of the abdomen and pelvis recently which continued to show fatty liver. Because of this she came to see me. She read that tamoxifen can increase her cholesterol levels. She was also diagnosed with hypertriglyceridemia  hypercholesterolemia.  REVIEW OF SYSTEMS:   Constitutional: Denies fevers, chills or abnormal weight loss Eyes: Denies blurriness of vision Ears, nose, mouth, throat, and face: Denies mucositis or sore throat Respiratory: Denies cough, dyspnea or wheezes Cardiovascular: Denies palpitation, chest discomfort Gastrointestinal:  Denies nausea, heartburn or change in bowel habits Skin: Denies abnormal skin rashes Lymphatics: Denies new lymphadenopathy or easy bruising Neurological:Denies numbness, tingling or new weaknesses Behavioral/Psych: Mood is stable, no new changes  Extremities: No lower extremity edema Breast:  denies any pain or lumps or nodules in either breasts All other systems were reviewed with the patient and are negative.  I have reviewed the past medical history, past surgical history, social history and family history with the patient and they are unchanged from previous note.  ALLERGIES:  has No Known Allergies.  MEDICATIONS:  Current Outpatient Prescriptions  Medication Sig Dispense Refill  . ALPRAZolam (XANAX) 0.5 MG tablet Take 0.25 mg by mouth at bedtime as needed for anxiety.    Marland Kitchen loratadine (CLARITIN) 10 MG tablet Take 10 mg by mouth daily.    . montelukast (SINGULAIR) 10 MG tablet Take 10 mg by mouth at bedtime.    . sertraline (ZOLOFT) 50 MG tablet Take 150 mg by mouth daily.    . tamoxifen (NOLVADEX) 20 MG tablet Take 1 tablet (20 mg total) by mouth daily. 30 tablet 11   No current facility-administered medications for this visit.  PHYSICAL EXAMINATION: ECOG PERFORMANCE STATUS: 0 - Asymptomatic  Vitals:   09/23/16 1447  BP: 117/74  Pulse: 92  Resp: 18  Temp: 97.9 F (36.6 C)   Filed Weights   09/23/16 1447  Weight: 192 lb 11.2 oz (87.4 kg)    GENERAL:alert, no distress and comfortable SKIN: skin color, texture, turgor are normal, no rashes or significant lesions EYES: normal, Conjunctiva are pink and non-injected, sclera  clear OROPHARYNX:no exudate, no erythema and lips, buccal mucosa, and tongue normal  NECK: supple, thyroid normal size, non-tender, without nodularity LYMPH:  no palpable lymphadenopathy in the cervical, axillary or inguinal LUNGS: clear to auscultation and percussion with normal breathing effort HEART: regular rate & rhythm and no murmurs and no lower extremity edema ABDOMEN:abdomen soft, non-tender and normal bowel sounds MUSCULOSKELETAL:no cyanosis of digits and no clubbing  NEURO: alert & oriented x 3 with fluent speech, no focal motor/sensory deficits EXTREMITIES: No lower extremity edema  LABORATORY DATA:  I have reviewed the data as listed   Chemistry      Component Value Date/Time   NA 140 12/13/2015 1507   K 3.7 12/13/2015 1507   CO2 25 12/13/2015 1507   BUN 11.3 12/13/2015 1507   CREATININE 0.9 12/13/2015 1507      Component Value Date/Time   CALCIUM 9.6 12/13/2015 1507   ALKPHOS 78 12/13/2015 1507   AST 43 (H) 12/13/2015 1507   ALT 84 (H) 12/13/2015 1507   BILITOT 0.58 12/13/2015 1507       Lab Results  Component Value Date   WBC 9.6 12/13/2015   HGB 13.2 12/13/2015   HCT 40.0 12/13/2015   MCV 90.1 12/13/2015   PLT 198 12/13/2015   NEUTROABS 5.8 12/13/2015    ASSESSMENT & PLAN:  Malignant neoplasm of lower-inner quadrant of left breast in female, estrogen receptor positive ([place]) Left breast lumpectomy 02/26/1998: Invasive ductal carcinoma high-grade, 1.4 cm, LVIDD, with high-grade DCIS, 2/9 lymph nodes positive T1 cN1 M0 stage II a, ER 10% PR 0% her2 3+ at [facility] status post adjuvant chemotherapy with Adriamycin and Cytoxan 8 followed by radiation and 5 years of tamoxifen completed in December 2005, BRCA1 and 2 mutation negative, breast reduction and lift surgeries February 2014  Current treatment: Tamoxifen resumed October 2014 because of persistent right breast discomfort and patient's anxiety for recurrence of breast cancer in the right breast. Will  discontinue tamoxifen 09/23/2016 due to concern for elevation of LFTs from fatty liver.  Breast cancer surveillance: 1. Breast exam 09/23/2016  is without abnormalities 2. Mammogram 12/26/2015 normal, breast density category C  Elevated AST and ALT:due to fatty liver. Ultrasound liver confirmed fatty liver. recent CT scan also confirmed this. CT abdomen pelvis ordered by Dr. Rex Kras: 09/12/2016: Hepatic steatosis and hepatomegaly, subtle nodularity in the right abdominal retroperitoneal could be reactive lymph nodes. Three-month follow-up recommended.  I reviewed the CT scan report of the patient and discussed with her that these lymph nodes don't seem to be overtly concerning to me. Based on the radiologist recommendation a three-month follow-up is reasonable.  Because of her concerns for fatty liver, I recommended discontinuation of tamoxifen therapy. However I stressed the importance of physical exercise as well as consideration for anticholesterol medications in combating fatty liver.  Return to clinic in 1 year for follow-up  I spent 25 minutes talking to the patient of which more than half was spent in counseling and coordination of care.  No orders of the defined types were placed in this encounter.  The patient has a good understanding of the overall plan. she agrees with it. she will call with any problems that may develop before the next visit here.   Rulon Eisenmenger, MD 09/23/16

## 2016-09-23 NOTE — Assessment & Plan Note (Signed)
Left breast lumpectomy 02/26/1998: Invasive ductal carcinoma high-grade, 1.4 cm, LVIDD, with high-grade DCIS, 2/9 lymph nodes positive T1 cN1 M0 stage II a, ER 10% PR 0% her2 3+ at [facility] status post adjuvant chemotherapy with Adriamycin and Cytoxan 8 followed by radiation and 5 years of tamoxifen completed in December 2005, BRCA1 and 2 mutation negative, breast reduction and lift surgeries February 2014  Current treatment: Tamoxifen resumed October 2014 because of persistent right breast discomfort and patient's anxiety for recurrence of breast cancer in the right breast. Plan is to treat her for 5 additional years to complete in October 2019  Breast cancer surveillance: 1. Breast exam 09/23/2016  is without abnormalities 2. Mammogram 12/26/2015 normal, breast density category C  Elevated AST and ALT: I suspect that she has fatty liver. Ultrasound liver confirmed fatty liver CT abdomen pelvis ordered by Dr. Rex Kras: 09/12/2016: Hepatic steatosis and hepatomegaly, subtle nodularity in the right abdominal retroperitoneal could be reactive lymph nodes. Three-month follow-up recommended.  I reviewed the CT scan report of the patient and discussed with her that these lymph nodes don't seem to be overtly concerning to me. Based on the radiologist recommendation a three-month follow-up is reasonable.  Return to clinic in 1 year for follow-up

## 2016-11-17 ENCOUNTER — Telehealth: Payer: Self-pay | Admitting: Nurse Practitioner

## 2016-11-17 NOTE — Telephone Encounter (Signed)
Left message regarding upcoming appointment has been canceled and needs to be rescheduled. °

## 2016-11-26 ENCOUNTER — Ambulatory Visit: Payer: PRIVATE HEALTH INSURANCE | Admitting: Nurse Practitioner

## 2016-12-10 ENCOUNTER — Other Ambulatory Visit: Payer: PRIVATE HEALTH INSURANCE

## 2016-12-10 ENCOUNTER — Ambulatory Visit: Payer: PRIVATE HEALTH INSURANCE | Admitting: Hematology and Oncology

## 2017-05-03 IMAGING — CT CT ABDOMEN W/ CM
4 of 5 series · 13 of 36 positions shown, 19 images · IV contrast (READICAT/WATER & [ID] ISOVUE 300)
Comparison: 01/02/2016  ultrasound

CLINICAL DATA: Elevated liver function tests. Left lumpectomy for
breast cancer in 8999 with chemotherapy and radiation therapy. Chest
pain.

EXAM:
CT ABDOMEN WITH CONTRAST
TECHNIQUE: Multidetector CT imaging of the abdomen was performed using the
standard protocol following bolus administration of intravenous
contrast.
CONTRAST:  100mL 2PBS5N-AOO IOPAMIDOL (2PBS5N-AOO) INJECTION 61%

[Series 3: abdomen with · axial · 0.70mm/px · z∈[-268,-68]mm · 5 of 62 slices shown, 10 images]
[im 11/62  soft-tissue]
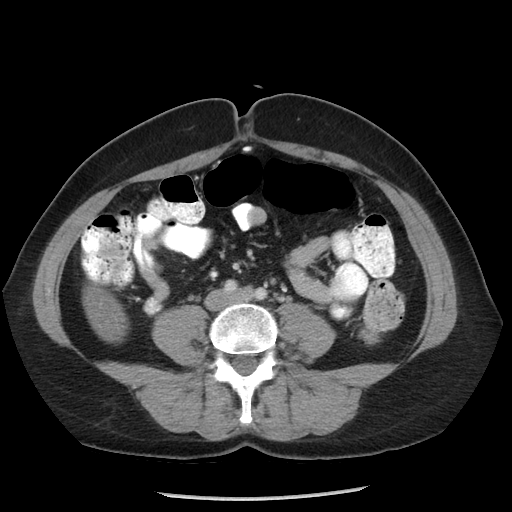
[im 11/62  bone]
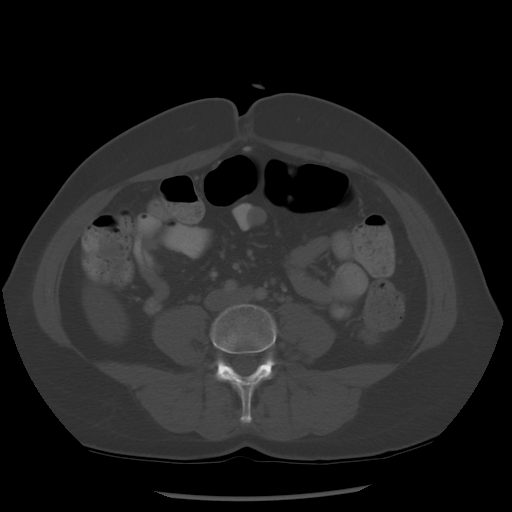
[im 21/62  soft-tissue]
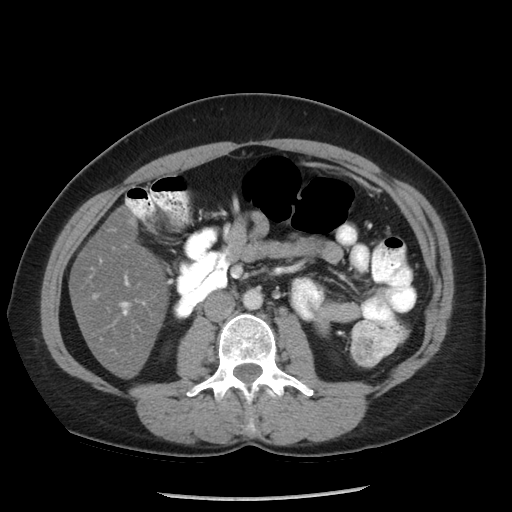
[im 21/62  lung]
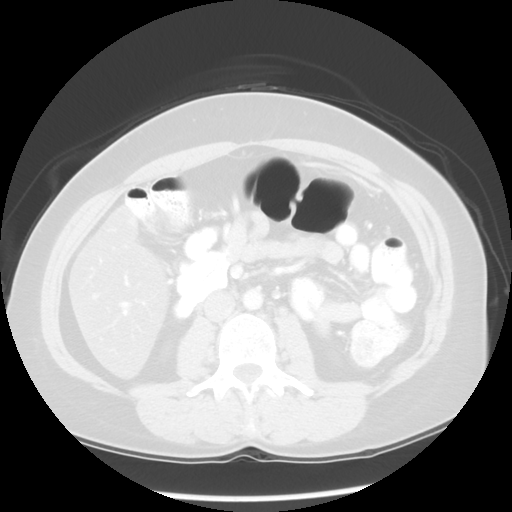
[im 31/62  soft-tissue]
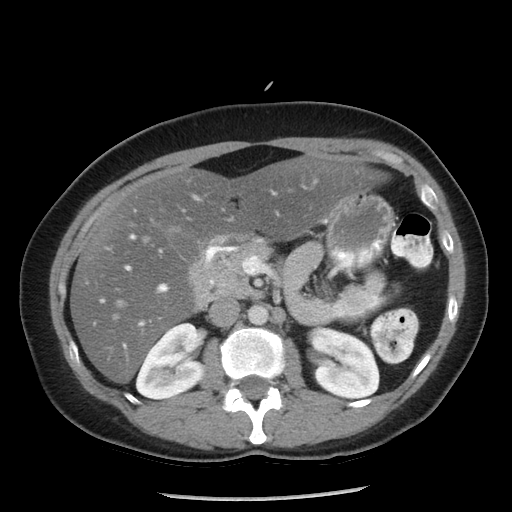
[im 31/62  lung]
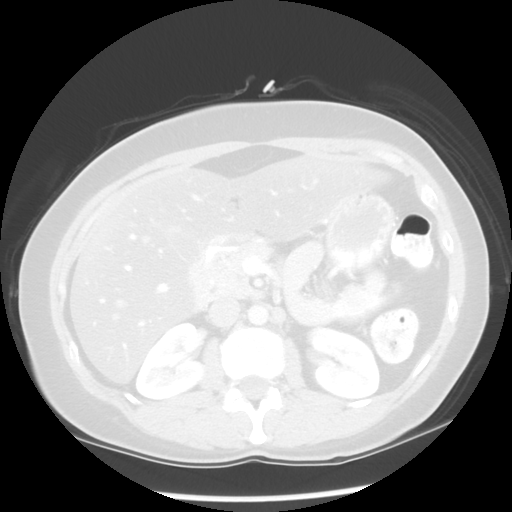
[im 41/62  soft-tissue]
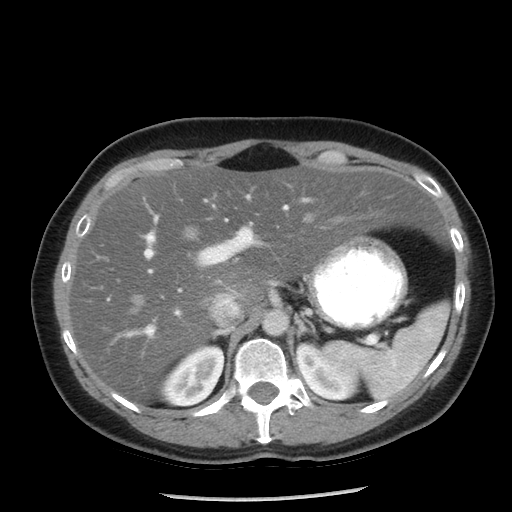
[im 41/62  lung]
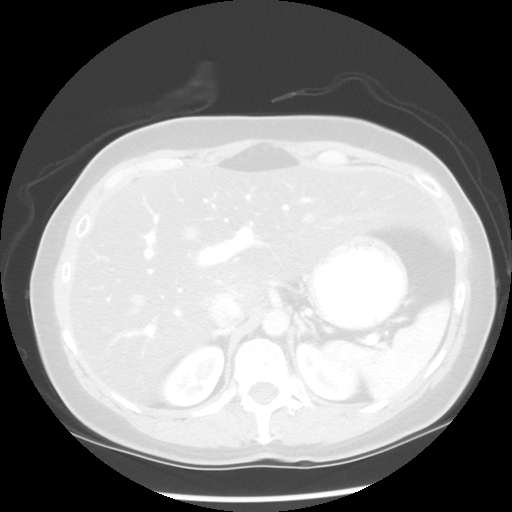
[im 51/62  soft-tissue]
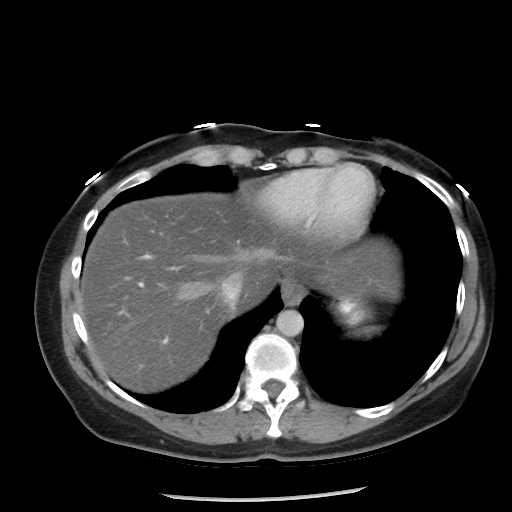
[im 51/62  lung]
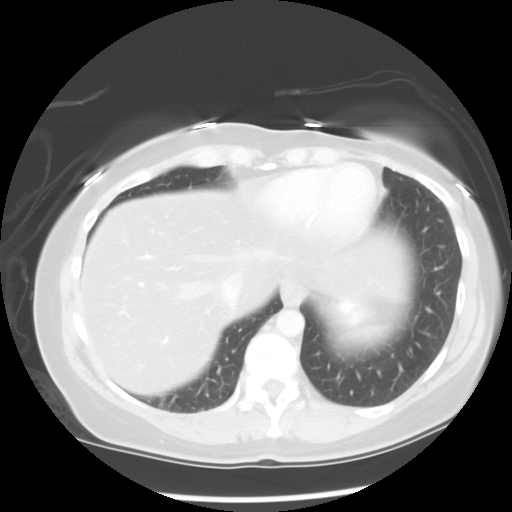

[Series 5: renal delay · axial · delayed · 0.70mm/px · z∈[-218,-118]mm · 3 of 40 slices shown]
[im 10/40  soft-tissue]
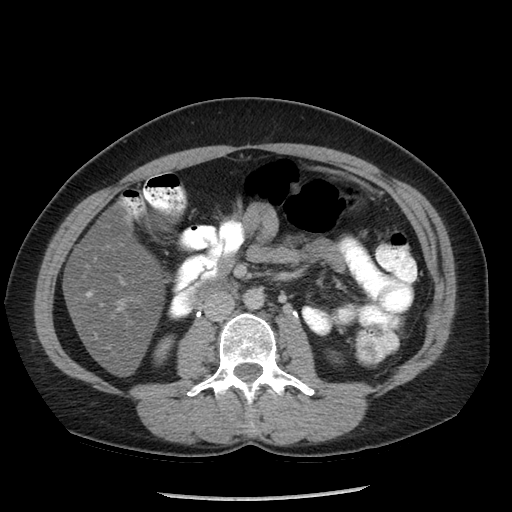
[im 20/40  soft-tissue]
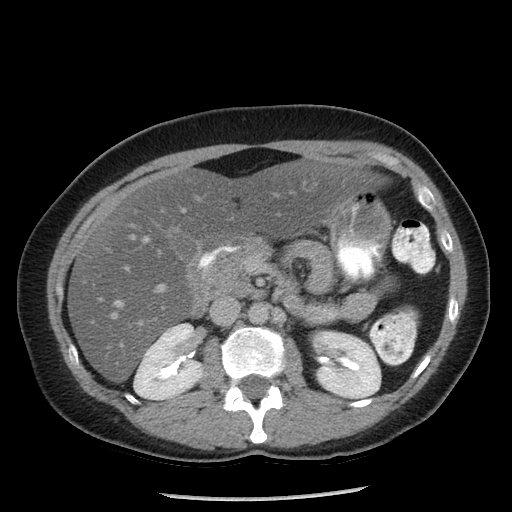
[im 30/40  soft-tissue]
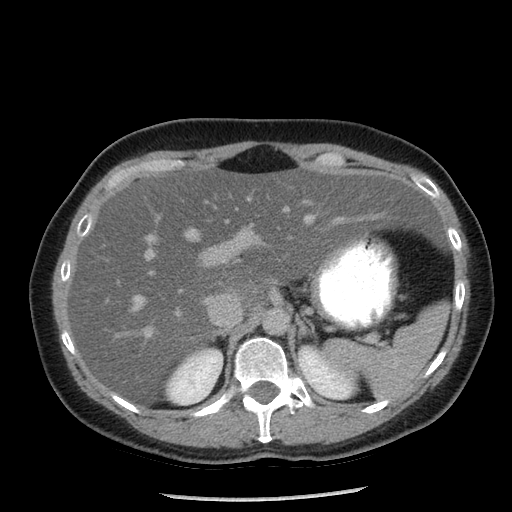

[Series 601: coronal body · coronal · 0.70mm/px · 1 of 110 slices shown, 2 images]
[im 37/110  soft-tissue]
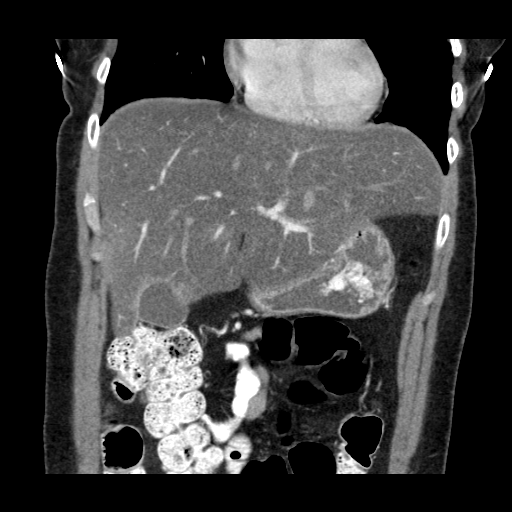
[im 37/110  bone]
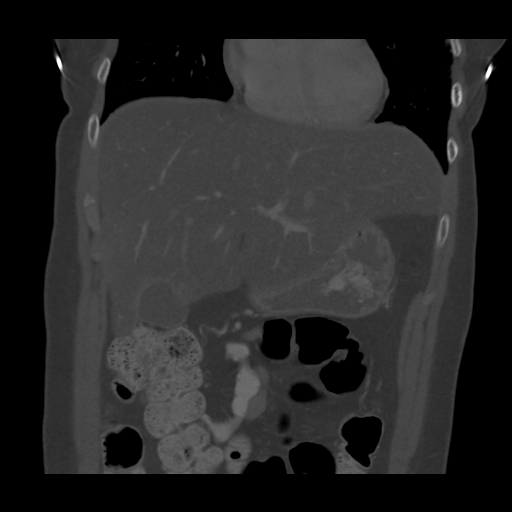

[Series 602: sagittal body · sagittal · 0.70mm/px · 4 of 145 slices shown]
[im 10/145  soft-tissue]
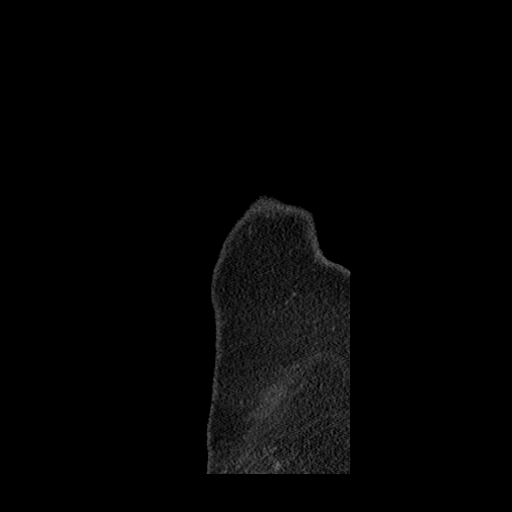
[im 28/145  soft-tissue]
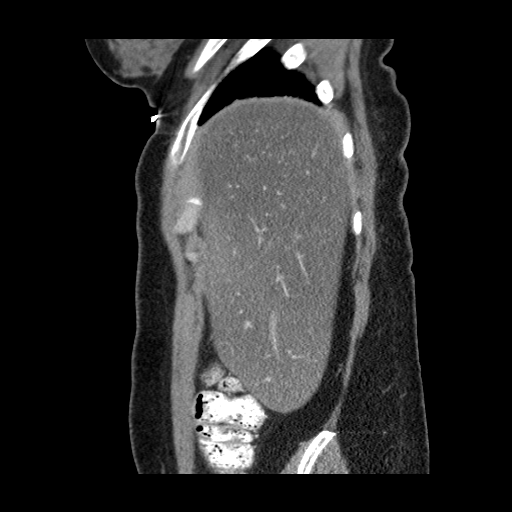
[im 46/145  soft-tissue]
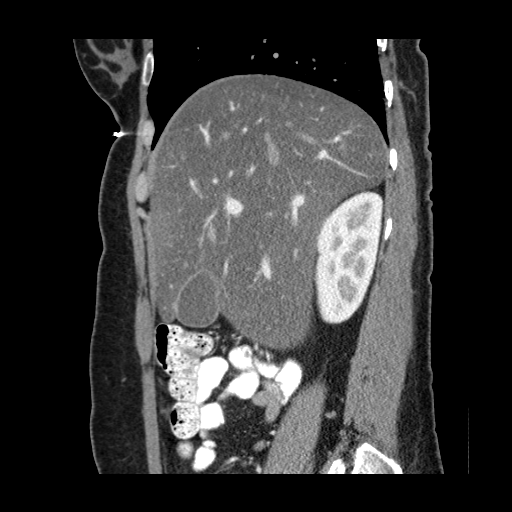
[im 64/145  soft-tissue]
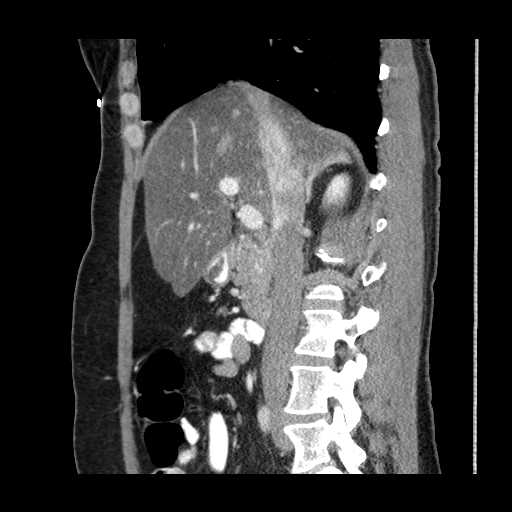

[13 of 36 positions shown; findings below may reference images not displayed]

FINDINGS: Lower chest: Clear lung bases. Normal heart size without pericardial
or pleural effusion.

Hepatobiliary: Marked hepatic steatosis and hepatomegaly. 21.5 cm
craniocaudal. Sparing adjacent the gallbladder. No focal liver
lesion. Normal gallbladder, without biliary ductal dilatation.

Pancreas: Normal, without mass or ductal dilatation.

Spleen: Normal in size, without focal abnormality.

Adrenals/Urinary Tract: Normal adrenal glands. Normal kidneys,
without hydronephrosis.

Stomach/Bowel: Normal stomach, without wall thickening. Normal
abdominal bowel loops.

Vascular/Lymphatic: Normal caliber of the aorta and branch vessels.
No retroperitoneal or retrocrural adenopathy.

Other: No ascites. nodularity within the right abdominal
retroperitoneum is most likely related to an atypical position of
lymph nodes. Example at 7 mm adjacent the right psoas muscle on
image 54/series 3. More laterally on image 55/series 3.

Musculoskeletal: Mild convex right lumbar spine curvature.
IMPRESSION: 1. Hepatic steatosis and hepatomegaly.
2.  No acute abdominal process.
3. Subtle nodularity within the a right abdominal retroperitoneum,
likely related to an unusual location of reactive lymph nodes. Given
the history of primary malignancy, this could be re-evaluated with
follow-up abdominal CT at 3 months.

## 2017-09-14 ENCOUNTER — Telehealth: Payer: Self-pay | Admitting: Hematology and Oncology

## 2017-09-14 NOTE — Telephone Encounter (Signed)
Called pt re appt being moved - wanted to cancel. [place]

## 2017-09-24 ENCOUNTER — Ambulatory Visit: Payer: PRIVATE HEALTH INSURANCE | Admitting: Hematology and Oncology

## 2017-09-24 ENCOUNTER — Other Ambulatory Visit: Payer: PRIVATE HEALTH INSURANCE

## 2017-09-28 ENCOUNTER — Ambulatory Visit: Payer: PRIVATE HEALTH INSURANCE | Admitting: Hematology and Oncology

## 2017-09-28 ENCOUNTER — Other Ambulatory Visit: Payer: PRIVATE HEALTH INSURANCE

## 2017-12-30 IMAGING — US US ABDOMEN COMPLETE
1 series · 14 of 25 positions shown · non-contrast
Comparison: None.

CLINICAL DATA: Elevated LFTs.

EXAM:
ABDOMEN ULTRASOUND COMPLETE

[Series 1: us abdomen complete · 0.21mm/px · 14 of 114 slices shown]
[im 1/114]
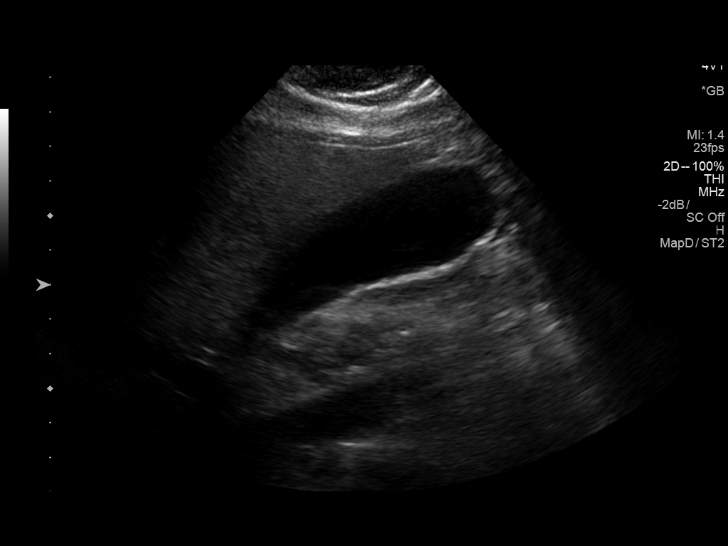
[im 10/114]
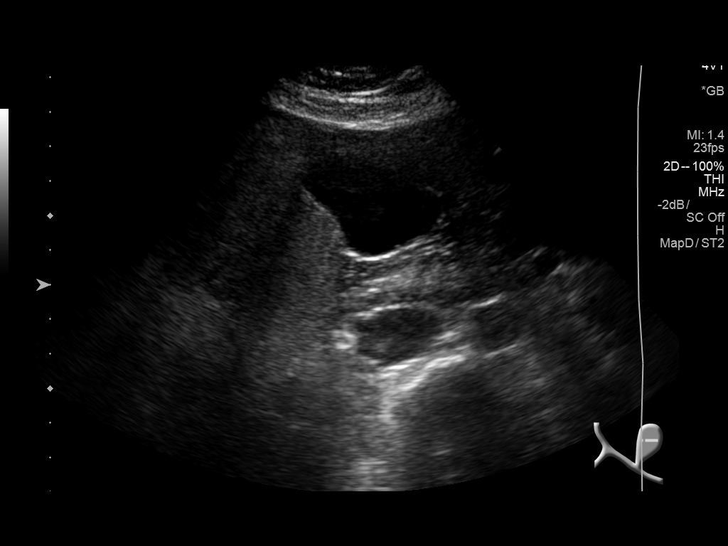
[im 19/114]
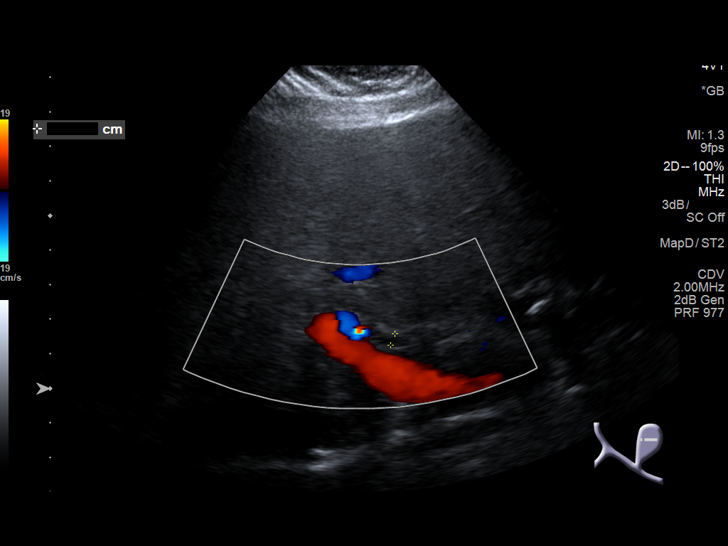
[im 29/114]
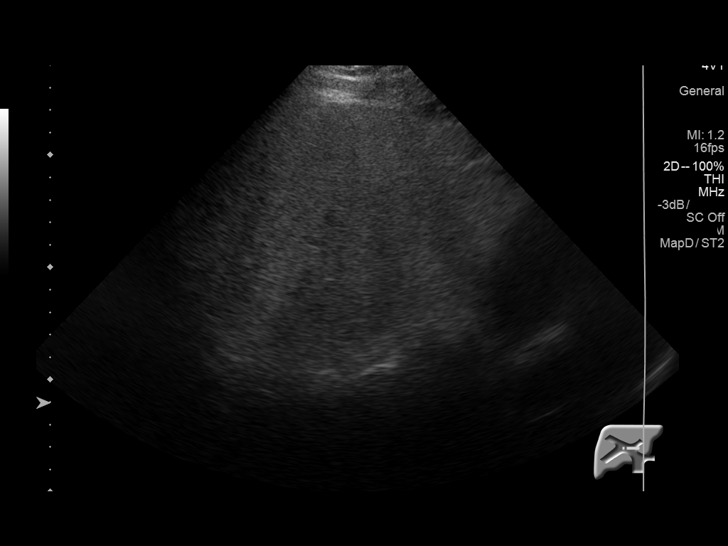
[im 38/114]
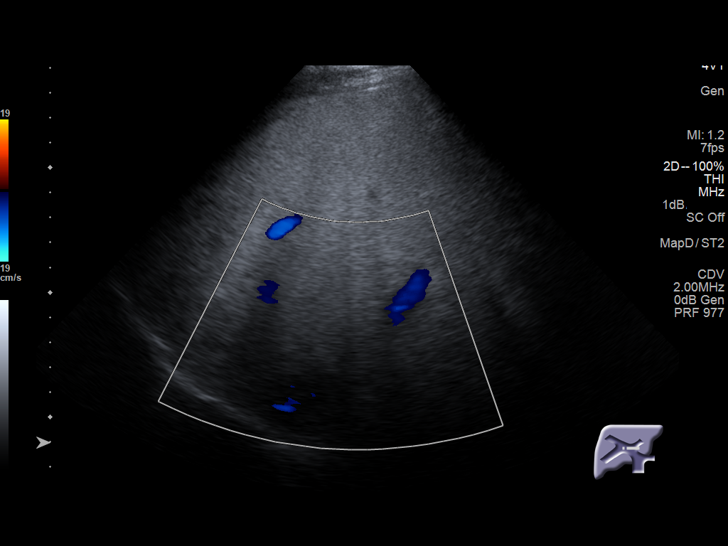
[im 43/114]
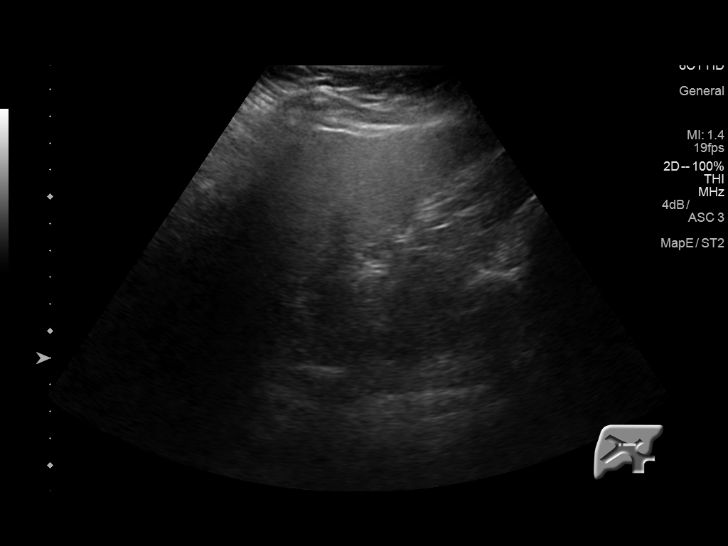
[im 52/114]
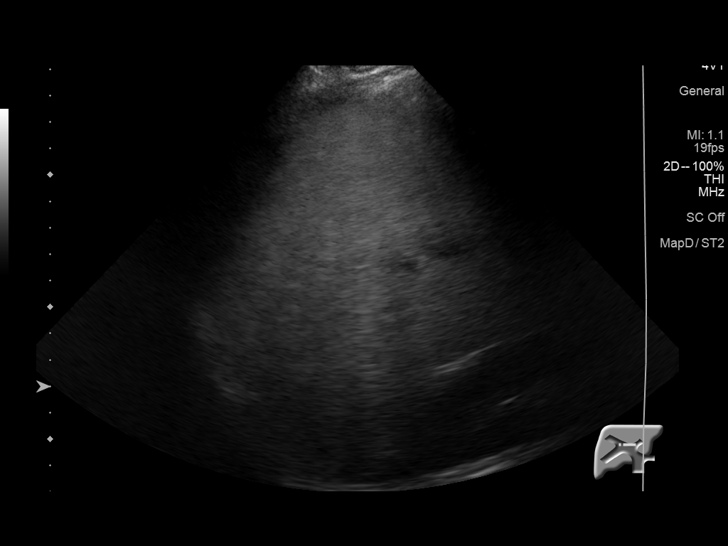
[im 62/114]
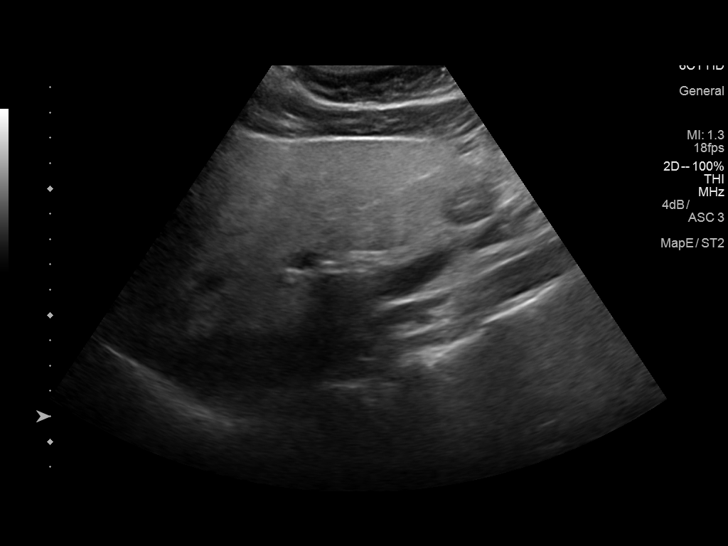
[im 71/114]
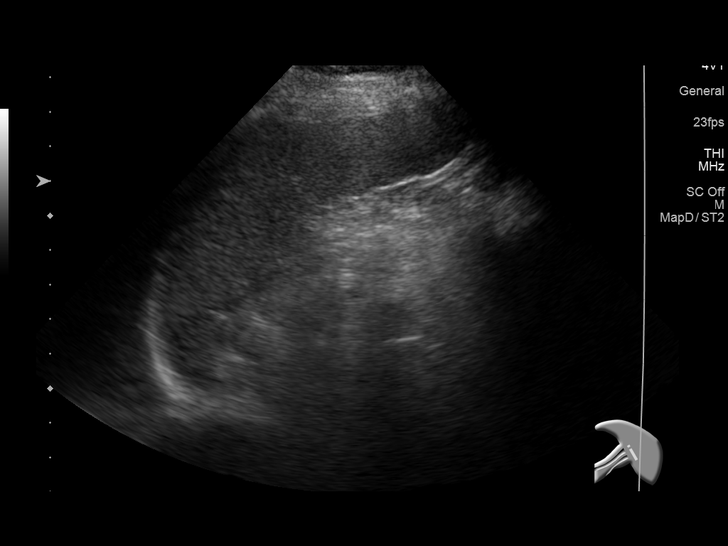
[im 76/114]
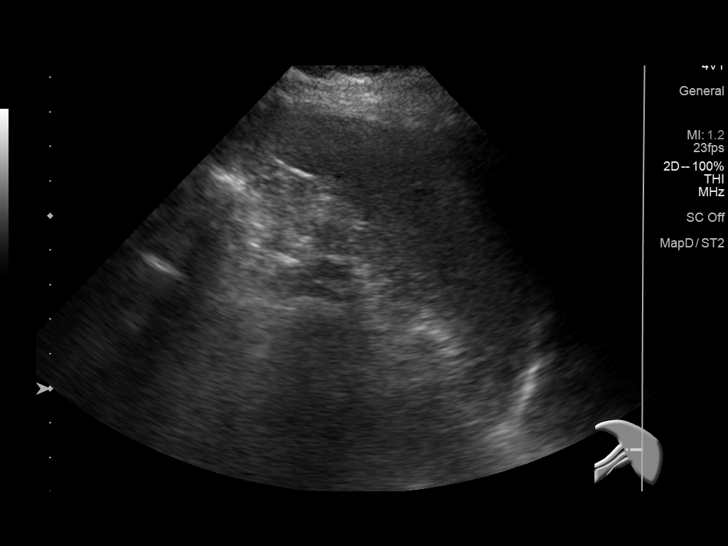
[im 85/114]
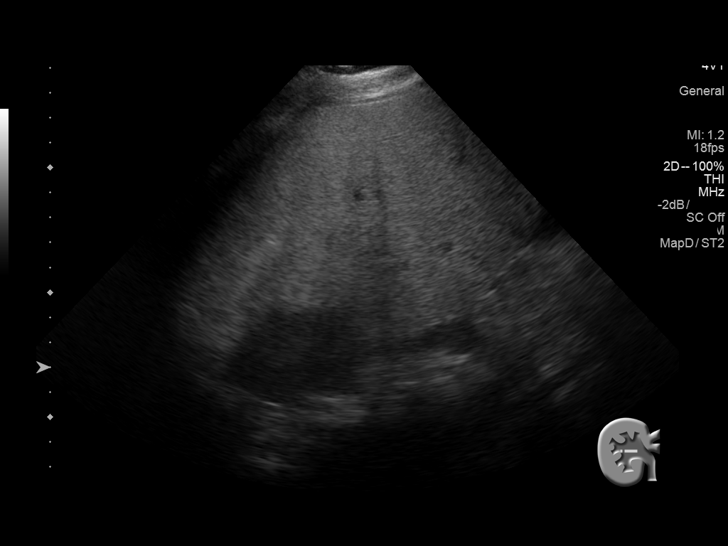
[im 95/114]
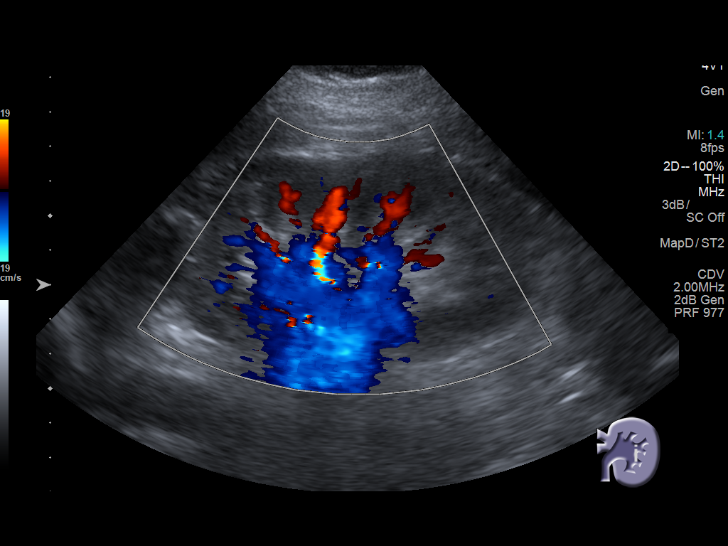
[im 104/114]
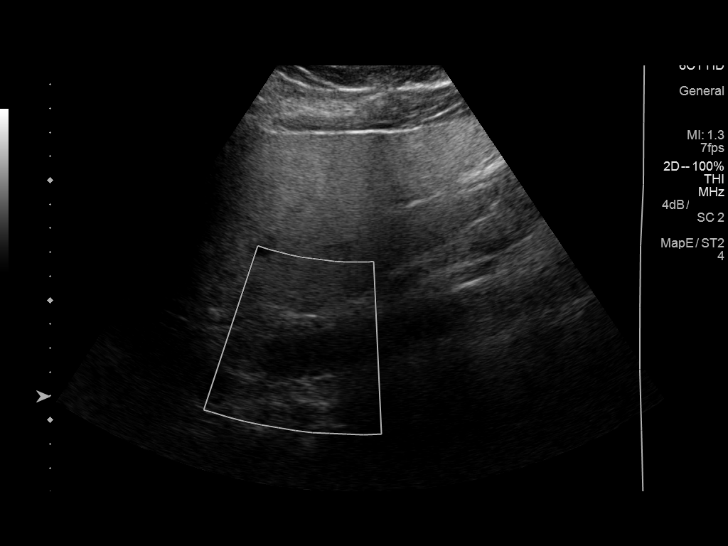
[im 114/114]
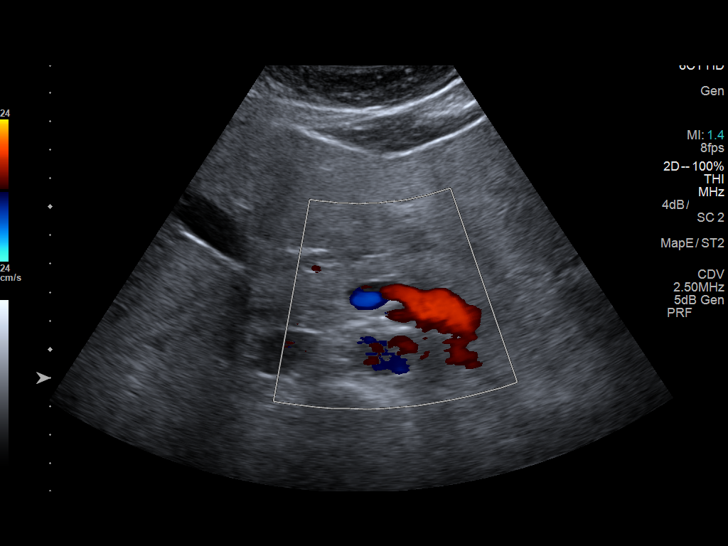

[14 of 25 positions shown; findings below may reference images not displayed]

FINDINGS: Gallbladder: No gallstones or wall thickening visualized. No
sonographic Murphy sign noted by sonographer.

Common bile duct: Diameter: 3.5 mm

Liver: Liver is echogenic consistent fatty infiltration and/or
hepatocellular disease. No focal hepatic abnormality

IVC: No abnormality visualized.

Pancreas: Visualized portion unremarkable.

Spleen: Size and appearance within normal limits.

Right Kidney: Length: 12.4 cm. Echogenicity within normal limits. No
mass or hydronephrosis visualized.

Left Kidney: Length: 11.0 cm. Echogenicity within normal limits. No
mass or hydronephrosis visualized.

Abdominal aorta: No aneurysm visualized.

Other findings: None.
IMPRESSION: 1. Liver is echogenic consistent with fatty infiltration and/or
hepatocellular disease.

2.  No acute or focal abnormality.
